# Patient Record
Sex: Male | Born: 1970 | Race: White | Hispanic: No | Smoking: Never smoker
Health system: Southern US, Community
[De-identification: ages and names within clinical notes are randomized; demographics above are authoritative.]

## PROBLEM LIST (undated history)

## (undated) HISTORY — PX: SINUSOTOMY: SHX291

---

## 2005-08-07 ENCOUNTER — Emergency Department (HOSPITAL_COMMUNITY): Admission: EM | Admit: 2005-08-07 | Discharge: 2005-08-07 | Payer: Self-pay | Admitting: Emergency Medicine

## 2008-02-11 ENCOUNTER — Emergency Department (HOSPITAL_COMMUNITY): Admission: EM | Admit: 2008-02-11 | Discharge: 2008-02-12 | Payer: Self-pay | Admitting: Emergency Medicine

## 2011-05-20 LAB — COMPREHENSIVE METABOLIC PANEL
ALT: 19
Alkaline Phosphatase: 54
BUN: 18
CO2: 27
Chloride: 105
GFR calc non Af Amer: >60
Glucose, Bld: 105 — ABNORMAL HIGH
Potassium: 3.7
Sodium: 138
Total Bilirubin: 0.7

## 2011-05-20 LAB — CBC
HCT: 40.5
Hemoglobin: 14.1
RBC: 4.34
RDW: 12.5

## 2011-05-20 LAB — DIFFERENTIAL
Basophils Absolute: 0
Basophils Relative: 1
Eosinophils Absolute: 0
Neutro Abs: 5.3
Neutrophils Relative %: 77

## 2011-05-20 LAB — POCT CARDIAC MARKERS
Myoglobin, poc: 41
Operator id: 151321
Operator id: 294341
Troponin i, poc: <0.05

## 2012-06-10 ENCOUNTER — Emergency Department (HOSPITAL_COMMUNITY): Payer: 59

## 2012-06-10 ENCOUNTER — Emergency Department (HOSPITAL_COMMUNITY)
Admission: EM | Admit: 2012-06-10 | Discharge: 2012-06-10 | Disposition: A | Discharge status: Home / Self Care | Payer: 59 | Attending: Emergency Medicine | Admitting: Emergency Medicine

## 2012-06-10 ENCOUNTER — Encounter (HOSPITAL_COMMUNITY): Payer: Self-pay | Admitting: *Deleted

## 2012-06-10 DIAGNOSIS — Y990 Civilian activity done for income or pay: Secondary | ICD-10-CM | POA: Insufficient documentation

## 2012-06-10 DIAGNOSIS — X500XXA Overexertion from strenuous movement or load, initial encounter: Secondary | ICD-10-CM | POA: Insufficient documentation

## 2012-06-10 DIAGNOSIS — S92309A Fracture of unspecified metatarsal bone(s), unspecified foot, initial encounter for closed fracture: Secondary | ICD-10-CM | POA: Insufficient documentation

## 2012-06-10 DIAGNOSIS — Y9269 Other specified industrial and construction area as the place of occurrence of the external cause: Secondary | ICD-10-CM | POA: Insufficient documentation

## 2012-06-10 DIAGNOSIS — R609 Edema, unspecified: Secondary | ICD-10-CM | POA: Insufficient documentation

## 2012-06-10 DIAGNOSIS — S92353A Displaced fracture of fifth metatarsal bone, unspecified foot, initial encounter for closed fracture: Secondary | ICD-10-CM

## 2012-06-10 MED ORDER — HYDROCODONE-ACETAMINOPHEN 5-325 MG PO TABS: 1.0000 | ORAL_TABLET | ORAL | Status: DC | PRN

## 2012-06-10 NOTE — ED Notes (Signed)
Pt was walking and rolled his foot.  He heard a pop and experienced pain.  He came immediately here.

## 2012-06-10 NOTE — ED Provider Notes (Signed)
History     CSN: [id number]  Arrival date & time 06/10/12  2113   None     Chief Complaint  Patient presents with  . Ankle Injury    (Consider location/radiation/quality/duration/timing/severity/associated sxs/prior treatment) HPI History provided by pt.   Pt is a IT sales professional.  Was wearing new hard-soled boots, did not have them laced tightly, inverted his foot while bending over top pick something up off of the ground and has had pain of right lateral foot ever since.  Aggravated by bearing weight and associated w/ edema.  Denies paresthesias.  Denies ankle pain.  History reviewed. No pertinent past medical history.  Past Surgical History  Procedure Date  . Sinusotomy     No family history on file.  History  Substance Use Topics  . Smoking status: Never Smoker   . Smokeless tobacco: Not on file  . Alcohol Use: No      Review of Systems  All other systems reviewed and are negative.    Allergies  Penicillins  Home Medications  No current outpatient prescriptions on file.  BP 157/71  Pulse 64  Temp 98.5 F (36.9 C) (Oral)  Resp 18  SpO2 98%  Physical Exam  Nursing note and vitals reviewed. Constitutional: He is oriented to person, place, and time. He appears well-developed and well-nourished. No distress.  HENT:  Head: Normocephalic and atraumatic.  Eyes:       Normal appearance  Neck: Normal range of motion.  Pulmonary/Chest: Effort normal.  Musculoskeletal: Normal range of motion.       Edema, mild ecchymosis and tenderness of fifth metatarsal.  Full active ROM of all toes as well as ankle w/out pain.  2+ DP pulse and distal sensation intact.    Neurological: He is alert and oriented to person, place, and time.  Psychiatric: He has a normal mood and affect. His behavior is normal.    ED Course  Procedures (including critical care time)  Labs Reviewed - No data to display Dg Foot Complete Left  06/10/2012  *RADIOLOGY REPORT*  Clinical Data:  Pain and swelling near the fifth metatarsal region after twisting injury.  LEFT FOOT - COMPLETE 3+ VIEW  Comparison: None.  Findings: Degenerative changes in the first metatarsophalangeal joint.  Transverse linear lucency at the base of the fifth metatarsal consistent with nondisplaced fracture.  Overlying soft tissue swelling.  No additional fractures.  No focal bone lesions.  IMPRESSION: Nondisplaced transverse fracture of the proximal fifth metatarsal. Degenerative changes in the first metatarsophalangeal joint.   Original Report Authenticated By: Marlon Pel, M.D.      1. Fracture of fifth metatarsal bone       MDM  [phone] M presents w/ inversion injury of L foot. Xray shows fx of right 5th metatarsal.  Results discussed w/ pt.  Ortho tech placed in cam walker.  Offered posterior splint but patient prefers not to use crutches.  Prescribed vicodin for pain, recommended ice and elevation and referred to ortho. 11:18 PM        Otilio Miu, PA 06/10/12 [phone]

## 2012-06-10 NOTE — ED Notes (Signed)
Patient given copy of discharge paperwork; went over discharge instructions with patient.  Patient instructed to take Vicodin as directed, to not drive or drink while taking Vicodin, to follow up with [facility] first thing Monday morning, and to return to the ED for new, worsening, or concerning symptoms.

## 2012-06-10 NOTE — ED Notes (Signed)
Patient currently resting quietly in bed; no respiratory or acute distress noted.  Patient updated on plan of care; informed patient that we are currently waiting on ortho to come and apply cam walker.  Patient has no other questions or concerns at this time; will continue to monitor.

## 2012-06-11 NOTE — ED Provider Notes (Signed)
Medical screening examination/treatment/procedure(s) were performed by non-physician practitioner and as supervising physician I was immediately available for consultation/collaboration.  Brandt Loosen, MD 06/11/12 [phone]

## 2016-03-15 ENCOUNTER — Other Ambulatory Visit: Payer: Self-pay | Admitting: Occupational Medicine

## 2016-03-15 ENCOUNTER — Ambulatory Visit: Payer: Self-pay

## 2016-03-15 DIAGNOSIS — M79675 Pain in left toe(s): Secondary | ICD-10-CM

## 2016-08-16 ENCOUNTER — Emergency Department (HOSPITAL_BASED_OUTPATIENT_CLINIC_OR_DEPARTMENT_OTHER)
Admission: EM | Admit: 2016-08-16 | Discharge: 2016-08-16 | Disposition: A | Discharge status: Home / Self Care | Payer: Commercial Managed Care - HMO | Attending: Emergency Medicine | Admitting: Emergency Medicine

## 2016-08-16 ENCOUNTER — Encounter (HOSPITAL_BASED_OUTPATIENT_CLINIC_OR_DEPARTMENT_OTHER): Payer: Self-pay

## 2016-08-16 DIAGNOSIS — Y929 Unspecified place or not applicable: Secondary | ICD-10-CM | POA: Diagnosis not present

## 2016-08-16 DIAGNOSIS — T23262A Burn of second degree of back of left hand, initial encounter: Secondary | ICD-10-CM | POA: Diagnosis not present

## 2016-08-16 DIAGNOSIS — Y999 Unspecified external cause status: Secondary | ICD-10-CM | POA: Diagnosis not present

## 2016-08-16 DIAGNOSIS — X100XXA Contact with hot drinks, initial encounter: Secondary | ICD-10-CM | POA: Insufficient documentation

## 2016-08-16 DIAGNOSIS — Z23 Encounter for immunization: Secondary | ICD-10-CM | POA: Insufficient documentation

## 2016-08-16 DIAGNOSIS — T23002A Burn of unspecified degree of left hand, unspecified site, initial encounter: Secondary | ICD-10-CM | POA: Diagnosis present

## 2016-08-16 DIAGNOSIS — Y9389 Activity, other specified: Secondary | ICD-10-CM | POA: Insufficient documentation

## 2016-08-16 MED ORDER — LIDOCAINE 5 % EX OINT: 1.0000 "application " | TOPICAL_OINTMENT | Freq: Four times a day (QID) | CUTANEOUS | 0 refills | Status: DC | PRN

## 2016-08-16 MED ORDER — OXYCODONE HCL 5 MG PO TABS: 5.0000 mg | ORAL_TABLET | ORAL | 0 refills | Status: DC | PRN

## 2016-08-16 MED ORDER — LIDOCAINE 4 % EX CREA
TOPICAL_CREAM | Freq: Once | CUTANEOUS | Status: AC
  Administered 2016-08-16: 1 via TOPICAL
  Filled 2016-08-16: qty 5

## 2016-08-16 MED ORDER — FENTANYL CITRATE (PF) 100 MCG/2ML IJ SOLN
50.0000 ug | INTRAMUSCULAR | Status: DC | PRN
  Administered 2016-08-16: 50 ug via NASAL
  Filled 2016-08-16: qty 2

## 2016-08-16 MED ORDER — TETANUS-DIPHTH-ACELL PERTUSSIS 5-2.5-18.5 LF-MCG/0.5 IM SUSP
0.5000 mL | Freq: Once | INTRAMUSCULAR | Status: AC
  Administered 2016-08-16: 0.5 mL via INTRAMUSCULAR
  Filled 2016-08-16: qty 0.5

## 2016-08-16 MED ORDER — BACITRACIN ZINC 500 UNIT/GM EX OINT
TOPICAL_OINTMENT | Freq: Two times a day (BID) | CUTANEOUS | Status: DC
  Administered 2016-08-16: 1 via TOPICAL
  Filled 2016-08-16 (×2): qty 28.35

## 2016-08-16 NOTE — Discharge Instructions (Signed)
Place bacitracin over your burn, change dressing twice daily using a nonadhesive bandage. Take tylenol 1000mg  every 6 hours for pain and ibuprofen 400mg  every 4 hours and use oxycodone 5mg  as needed for breakthrough pain.

## 2016-08-16 NOTE — ED Triage Notes (Signed)
C/o coffee burn to left hand approx 1-2 hours PTA-blisters and peeling skin noted-pt presented to triage with hand in water

## 2016-08-16 NOTE — ED Notes (Signed)
Pt has good sensation and brisk cap refill to distal finger tips

## 2016-08-16 NOTE — ED Provider Notes (Signed)
[facility]   CSN: [id number] Arrival date & time: 08/16/16  1346     History   Chief Complaint Chief Complaint  Patient presents with  . Hand Burn    HPI Derek Hernandez is a 45 y.o. male.  HPI  Presents with concern for burn to the left hand. Patient making coffee this morning and cup fell down causing hot coffee grinds spill on his hand causing a burn. Patient not sure if UTD on tetanus.  Pain is severe however is improved when submerged in water. Incident happened just prior to arrival.   History reviewed. No pertinent past medical history.  There are no active problems to display for this patient.   Past Surgical History:  Procedure Laterality Date  . SINUSOTOMY         Home Medications    Prior to Admission medications   Medication Sig Start Date End Date Taking? Authorizing Provider  lidocaine (XYLOCAINE) 5 % ointment Apply 1 application topically 4 (four) times daily as needed. Apply 3 inches of cream or less every 6 hours as needed for pain. 08/16/16   Alvira MondayErin Jarryd Gratz, MD  oxyCODONE (ROXICODONE) 5 MG immediate release tablet Take 1 tablet (5 mg total) by mouth every 4 (four) hours as needed for severe pain. 08/16/16   Alvira MondayErin Naz Denunzio, MD    Family History No family history on file.  Social History Social History  Substance Use Topics  . Smoking status: Never Smoker  . Smokeless tobacco: Never Used  . Alcohol use No     Allergies   Penicillins   Review of Systems Review of Systems  Constitutional: Negative for fever.  HENT: Negative for sore throat.   Eyes: Negative for visual disturbance.  Respiratory: Negative for shortness of breath.   Genitourinary: Negative for difficulty urinating.  Skin: Positive for wound. Negative for rash.  Neurological: Negative for syncope and headaches.     Physical Exam Updated Vital Signs BP 123/95   Pulse 68   Temp 97.8 F (36.6 C) (Oral)   Resp 20   Ht 5\' 10"  (1.778 m)   Wt  180 lb (81.6 kg)   SpO2 95%   BMI 25.83 kg/m   Physical Exam  Constitutional: He is oriented to person, place, and time. He appears well-developed and well-nourished. No distress.  HENT:  Head: Normocephalic and atraumatic.  Eyes: Conjunctivae and EOM are normal.  Neck: Normal range of motion.  Cardiovascular: Normal rate, regular rhythm and intact distal pulses.   Pulmonary/Chest: Effort normal. No respiratory distress.  Abdominal: Soft.  Musculoskeletal: He exhibits no edema.  Neurological: He is alert and oriented to person, place, and time.  Skin: Skin is warm and dry. He is not diaphoretic.  Partial thickness burns to left hand: 3cm palmar radial side Indiex finger sparing radial side, middle finger sparing radial side, ring finger burn onradial side, skin peeling, pinky finger radial side Dorsum of hand, erythema, skin sloughing, no intact blisters No circumferential burns  Nursing note and vitals reviewed.    ED Treatments / Results  Labs (all labs ordered are listed, but only abnormal results are displayed) Labs Reviewed - No data to display  EKG  EKG Interpretation None       Radiology No results found.  Procedures Procedures (including critical care time)  Medications Ordered in ED Medications  Tdap (BOOSTRIX) injection 0.5 mL (0.5 mLs Intramuscular Given 08/16/16 1511)  lidocaine (LMX) 4 % cream (1 application Topical Given 08/16/16 1515)  Initial Impression / Assessment and Plan / ED Course  I have reviewed the triage vital signs and the nursing notes.  Pertinent labs & imaging results that were available during my care of the patient were reviewed by me and considered in my medical decision making (see chart for details).  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  Patient with partial thickness burns to left hand. No insensate areas, no sign of full  thickness at this time. No circumferential burns.  Updated tetanus. Gave bacitracin, recommend nonadherent bandage with bacitracin BID. Pt requiesting lidocaine ointment-gave rx for this with specific instructions to not use more than directed.  Rec tylenol, ibuprofen, oxycodone for breakthrough pain.  Patient discharged in stable condition with understanding of reasons to return.   Final Clinical Impressions(s) / ED Diagnoses   Final diagnoses:  Partial thickness burn of back of left hand, initial encounter    New Prescriptions Discharge Medication List as of 08/16/2016  3:09 PM    START taking these medications   Details  lidocaine (XYLOCAINE) 5 % ointment Apply 1 application topically 4 (four) times daily as needed. Apply 3 inches of cream or less every 6 hours as needed for pain., Starting Mon 08/16/2016, Print    oxyCODONE (ROXICODONE) 5 MG immediate release tablet Take 1 tablet (5 mg total) by mouth every 4 (four) hours as needed for severe pain., Starting Mon 08/16/2016, Print         Alvira MondayErin Denise Washburn, MD 08/16/16 2053

## 2016-08-16 NOTE — ED Notes (Signed)
Pt teaching provided on medications that may cause drowsiness. Pt instructed not to drive or operate heavy machinery while taking the prescribed medication. Pt verbalized understanding.   

## 2018-05-08 ENCOUNTER — Ambulatory Visit (INDEPENDENT_AMBULATORY_CARE_PROVIDER_SITE_OTHER): Payer: Self-pay

## 2018-05-08 ENCOUNTER — Encounter (INDEPENDENT_AMBULATORY_CARE_PROVIDER_SITE_OTHER): Payer: Self-pay | Admitting: Orthopaedic Surgery

## 2018-05-08 ENCOUNTER — Ambulatory Visit (INDEPENDENT_AMBULATORY_CARE_PROVIDER_SITE_OTHER): Payer: 59 | Admitting: Orthopaedic Surgery

## 2018-05-08 DIAGNOSIS — G8929 Other chronic pain: Secondary | ICD-10-CM

## 2018-05-08 DIAGNOSIS — M25551 Pain in right hip: Secondary | ICD-10-CM

## 2018-05-08 DIAGNOSIS — M25561 Pain in right knee: Secondary | ICD-10-CM

## 2018-05-08 NOTE — Progress Notes (Signed)
Office Visit Note   Patient: Derek Hernandez           Date of Birth: Dec 20, 1970           MRN: [id number] Visit Date: 05/08/2018              Requested by: Ignatius SpeckingVyas, Dhruv B, MD [address] [place], [place] [id number] PCP: Ignatius SpeckingVyas, Dhruv B, MD   Assessment & Plan: Visit Diagnoses:  1. Chronic pain of right knee   2. Pain in right hip     Plan: I did show him quad strengthening exercises especially ones involving the VMO.  I offered him a steroid injection in his right knee as well that would hopefully be diagnostic and therapeutic and he is agreeable to this.  He tolerated the injection well.  I would like to reevaluate his knee in 3 weeks.  If he still having issues we would obtain an MRI.  I do feel that his right hip has no issues.  All questions concerns were answered and addressed.  Follow-Up Instructions: Return in about 3 weeks (around 05/29/2018).   Orders:  Orders Placed This Encounter  Procedures  . XR HIP UNILAT W OR W/O PELVIS 1V RIGHT  . XR Knee 1-2 Views Right   No orders of the defined types were placed in this encounter.     Procedures: No procedures performed   Clinical Data: No additional findings.   Subjective: Chief Complaint  Patient presents with  . 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  He says really the knee was hurting the worst but it does hurt some of the hip as well.  He was doing CrossFit and has started to bother him quite a bit and is not really calm down after 3 months of resting his knee.  Hurts in the back of the knee.  He denies any groin pain.  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  He is never injured his hip or knee before.  He denies any locking catching.  Most of his pain in his knee is posterior medial.  HPI  Review of Systems He currently denies any headache, chest pain, shortness of breath, fever, chills, nausea,  vomiting.  Objective: Vital Signs: There were no vitals taken for this visit.  Physical Exam Is alert and oriented x3 and in no acute distress Ortho Exam Examined for his right hip is normal examination of his right knee does show patellofemoral crepitation with lateral tracking patella.  He has pain along the medial joint line as well.  His range of motion is full. Specialty Comments:  No specialty comments available.  Imaging: Xr Hip Unilat W Or W/o Pelvis 1v Right  Result Date: 05/08/2018 An AP pelvis and lateral of the right hip show no acute findings.  The hip joint is well-maintained with excellent space and no significant arthritic findings at all.  Xr Knee 1-2 Views Right  Result Date: 05/08/2018 2 views of the right knee show no acute findings.  The joint space is well-maintained.  There is patellofemoral arthritic changes.    PMFS History: There are no active problems to display for this patient.  History reviewed. No pertinent past medical history.  History reviewed. No pertinent family history.  Past Surgical History:  Procedure Laterality Date  . SINUSOTOMY     Social History   Occupational History  .  Not on file  Tobacco Use  . Smoking status: Never Smoker  . Smokeless tobacco: Never Used  Substance and Sexual Activity  . Alcohol use: No  . Drug use: No  . Sexual activity: Not on file

## 2018-05-29 ENCOUNTER — Encounter (INDEPENDENT_AMBULATORY_CARE_PROVIDER_SITE_OTHER): Payer: Self-pay | Admitting: Orthopaedic Surgery

## 2018-05-29 ENCOUNTER — Ambulatory Visit (INDEPENDENT_AMBULATORY_CARE_PROVIDER_SITE_OTHER): Payer: 59 | Admitting: Orthopaedic Surgery

## 2018-05-29 VITALS — Ht 70.0 in | Wt 180.0 lb

## 2018-05-29 DIAGNOSIS — G8929 Other chronic pain: Secondary | ICD-10-CM

## 2018-05-29 DIAGNOSIS — M25561 Pain in right knee: Principal | ICD-10-CM

## 2018-05-29 NOTE — Progress Notes (Signed)
The patient comes in today 3 weeks after a place a steroid injection in his right knee.  He says right knee pain is gone almost completely.  He has a little bit of pain over the IT band where he points to.  I did see his sister recently he will may take a look at his lumbar spine.  She showed me AP view on her phone.  It did show some degenerative changes at lower levels of lumbar spine.  He confirms that he is going to chiropractor and that treatment is helping.  He says his right knee is pain-free right now other than pain over the IT band.  On examination of his right knee he has full range of motion of his right knee and it is ligamentously stable.  There is no effusion.  His pain is only over the IT band.  We went over stretching exercises were his IT band.  All question concerns were answered and addressed.  He will follow-up as needed.  If his knee gets no problem in the future we would like to recommend an MRI.  He will continue quad training exercises as well.

## 2018-10-10 ENCOUNTER — Encounter (INDEPENDENT_AMBULATORY_CARE_PROVIDER_SITE_OTHER): Payer: Self-pay | Admitting: Orthopaedic Surgery

## 2018-10-10 ENCOUNTER — Ambulatory Visit (INDEPENDENT_AMBULATORY_CARE_PROVIDER_SITE_OTHER): Payer: 59 | Admitting: Orthopaedic Surgery

## 2018-10-10 ENCOUNTER — Other Ambulatory Visit (INDEPENDENT_AMBULATORY_CARE_PROVIDER_SITE_OTHER): Payer: Self-pay

## 2018-10-10 DIAGNOSIS — G8929 Other chronic pain: Secondary | ICD-10-CM

## 2018-10-10 DIAGNOSIS — M25561 Pain in right knee: Principal | ICD-10-CM

## 2018-10-10 NOTE — Progress Notes (Signed)
Office Visit Note   Patient: Derek Hernandez           Date of Birth: [birth year]           MRN: [id number] Visit Date: 10/10/2018              Requested by: Ignatius Specking, MD [address] [place], [place] [address] PCP: Ignatius Specking, MD   Assessment & Plan: Visit Diagnoses:  1. Chronic pain of right knee     Plan: Given the failure of all forms of conservative treatment including the failure of injections and, an MRI is warranted of his right knee to rule out a meniscal tear.  Of interest to see what the cartilage looks like in the patellofemoral joint as well as patella tendon itself in the quad tendon itself in the amount of pain he is having.  I do feel this is medically warranted at this point given the locking catching as well and her clinical exam findings.  He will avoid squats and lunges for now.  We will see him back in 2 weeks hopefully go over an MRI of his right knee.  All question concerns were answered and addressed.  Follow-Up Instructions: Return in about 2 weeks (around 10/24/2018).   Orders:  No orders of the defined types were placed in this encounter.  No orders of the defined types were placed in this encounter.     Procedures: No procedures performed   Clinical Data: No additional findings.   Subjective: Chief Complaint  Patient presents with  . Right Knee - Follow-up, Pain  The patient comes in today with an acute exacerbation of chronic right knee issues.  I have seen him for this knee before.  He is not overweight at all.  He is very athletic and active.  He is a Corporate treasurer.  He is tried activity modification.  He is work on Freight forwarder.  He is taken anti-inflammatories.  We have tried to offload the knee.  He is even had an intra-articular steroid injection which did alleviate his symptoms for a while.  At this point he is having locking and catching in his knee and he does have intermittent swelling.  It hurts in the back of his knee  and feels full back there.  He gets a lot of popping in his knee that is newer to him.  He denies any hip pain.  Denies any numbness and tingling in his feet.  He denies any low back issues.  He is not a diabetic.  HPI  Review of Systems He currently denies any headache, chest pain, shortness of breath, fever, chills, nausea, vomiting.  Objective: Vital Signs: There were no vitals taken for this visit.  Physical Exam He is alert and orient x3 and in no acute distress.  He is very physically fit and thin. Ortho Exam Examination of his left knee shows no effusion today.  He has pain along the lateral collateral ligament and IT band.  He also has pain at the inferior pole patella and the quad tendon.  He has a positive Murray sign to the lateral compartment of his knee.  There is lateral joint line tenderness.  When I start flexion past 90 degrees and externally rotate the knee he gets a lot of pain in his knee.  It feels ligamentously stable.  The patella does track laterally with flexion extension. Specialty Comments:  No specialty comments available.  Imaging: No results found.  X-rays from last year are independently reviewed again and show patellofemoral arthritic change in the knee but well-maintained alignment and well-maintained medial lateral compartments.  PMFS History: There are no active problems to display for this patient.  History reviewed. No pertinent past medical history.  History reviewed. No pertinent family history.  Past Surgical History:  Procedure Laterality Date  . SINUSOTOMY     Social History   Occupational History  . Not on file  Tobacco Use  . Smoking status: Never Smoker  . Smokeless tobacco: Never Used  Substance and Sexual Activity  . Alcohol use: No  . Drug use: No  . Sexual activity: Not on file

## 2018-10-20 ENCOUNTER — Ambulatory Visit
Admission: RE | Admit: 2018-10-20 | Discharge: 2018-10-20 | Disposition: A | Discharge status: Home / Self Care | Payer: 59 | Source: Ambulatory Visit | Attending: Orthopaedic Surgery | Admitting: Orthopaedic Surgery

## 2018-10-20 DIAGNOSIS — G8929 Other chronic pain: Secondary | ICD-10-CM

## 2018-10-20 DIAGNOSIS — M25561 Pain in right knee: Principal | ICD-10-CM

## 2018-10-24 ENCOUNTER — Telehealth (INDEPENDENT_AMBULATORY_CARE_PROVIDER_SITE_OTHER): Payer: Self-pay

## 2018-10-24 ENCOUNTER — Encounter (INDEPENDENT_AMBULATORY_CARE_PROVIDER_SITE_OTHER): Payer: Self-pay | Admitting: Orthopaedic Surgery

## 2018-10-24 ENCOUNTER — Ambulatory Visit (INDEPENDENT_AMBULATORY_CARE_PROVIDER_SITE_OTHER): Payer: 59 | Admitting: Orthopaedic Surgery

## 2018-10-24 DIAGNOSIS — M1711 Unilateral primary osteoarthritis, right knee: Secondary | ICD-10-CM

## 2018-10-24 DIAGNOSIS — G8929 Other chronic pain: Secondary | ICD-10-CM | POA: Diagnosis not present

## 2018-10-24 DIAGNOSIS — M25561 Pain in right knee: Secondary | ICD-10-CM | POA: Diagnosis not present

## 2018-10-24 NOTE — Progress Notes (Signed)
The patient comes in today to go over an MRI of his right knee.  He is a very thin and active athletic individual with chronic right knee issues.  He had already had a steroid injection in his knee as well.  On exam his patella does track laterally and her significant patellofemoral grinding.  There is no medial lateral joint line tenderness and the knee is ligamentously stable.  There is no effusion.  The MRI does show advanced for age arthritis in his knee.  There is denuding of the cartilage of the lateral patella facet in the trochlear groove.  The medial lateral compartments are well-maintained.  His ACL and PCL are intact.  The medial lateral meniscus are intact.  He does have arthritis in his knee and he is a perfect candidate for hyaluronic acid at this standpoint.  He knows to avoid high impact aerobic activities as well.  All questions concerns were answered and addressed.  We will work on ordering hyaluronic acid injection for his right knee which I do feel is very appropriate given the fact that most of his cartilage is intact except for the patellofemoral joint and this can help his knee in general.  We will see him back in 3 weeks to hopefully place hyaluronic acid into the right knee.

## 2018-10-24 NOTE — Telephone Encounter (Signed)
Right knee gel injection  

## 2018-10-26 ENCOUNTER — Telehealth (INDEPENDENT_AMBULATORY_CARE_PROVIDER_SITE_OTHER): Payer: Self-pay

## 2018-10-26 NOTE — Telephone Encounter (Signed)
Noted  

## 2018-10-26 NOTE — Telephone Encounter (Signed)
Submitted VOB for Durolane, right knee. 

## 2018-11-13 ENCOUNTER — Telehealth (INDEPENDENT_AMBULATORY_CARE_PROVIDER_SITE_OTHER): Payer: Self-pay

## 2018-11-13 NOTE — Telephone Encounter (Signed)
Called patient and asked the screening questions.  Do you have now or have you had in the past 7 days a fever and/or chills? NO  Do you have now or have you had in the past 7 days a cough? NO  Do you have now or have you had in the last 7 days nausea, vomiting or abdominal pain? NO  Have you been exposed to anyone who has tested positive for COVID-19? NO  Have you or anyone who lives with you traveled within the last month? NO 

## 2018-11-14 ENCOUNTER — Encounter (INDEPENDENT_AMBULATORY_CARE_PROVIDER_SITE_OTHER): Payer: Self-pay | Admitting: Orthopaedic Surgery

## 2018-11-14 ENCOUNTER — Ambulatory Visit (INDEPENDENT_AMBULATORY_CARE_PROVIDER_SITE_OTHER): Payer: 59 | Admitting: Orthopaedic Surgery

## 2018-11-14 ENCOUNTER — Other Ambulatory Visit: Payer: Self-pay

## 2018-11-14 DIAGNOSIS — M1711 Unilateral primary osteoarthritis, right knee: Secondary | ICD-10-CM

## 2018-11-14 MED ORDER — SODIUM HYALURONATE 60 MG/3ML IX PRSY
60.0000 mg | PREFILLED_SYRINGE | INTRA_ARTICULAR | Status: AC | PRN
  Administered 2018-11-14: 60 mg via INTRA_ARTICULAR

## 2018-11-14 NOTE — Progress Notes (Signed)
   Procedure Note  Patient: Derek Hernandez             Date of Birth: 08/13/1971           MRN: [id number]             Visit Date: 11/14/2018  Procedures: Visit Diagnoses: Unilateral primary osteoarthritis, right knee  Large Joint Inj on 11/14/2018 9:03 AM Indications: diagnostic evaluation and pain Details: 22 G 1.5 in needle, superolateral approach  Arthrogram: No  Medications: 60 mg Sodium Hyaluronate 60 MG/3ML Outcome: tolerated well, no immediate complications Procedure, treatment alternatives, risks and benefits explained, specific risks discussed. Consent was given by the patient. Immediately prior to procedure a time out was called to verify the correct patient, procedure, equipment, support staff and site/side marked as required. Patient was prepped and draped in the usual sterile fashion.     The patient is here today for scheduled hyaluronic acid injection with Durolane to treat the pain from osteoarthritis.  He is tolerated steroid injections well.  He has had no other acute changes in medical status.  He does have family members that have had joint replacements at earlier ages.  He denies any instability with his right knee.  On exam there is no right knee effusion.  He is got excellent range of motion of the knee and is ligamentously stable.  He has slight varus malalignment with medial joint line tenderness.  He tolerated the Durolane injection well.  All questions concerns were answered and addressed.  Follow-up will be as needed.  Certainly if he has worsening pain over the next few months we can always try a steroid injection if needed.

## 2021-02-14 IMAGING — MR MR KNEE*R* W/O CM
6 series · 37 of 40 positions shown · non-contrast
Comparison: None.

CLINICAL DATA: Medial and anterior right knee pain for 6 months. No
known injury.

EXAM:
MRI OF THE RIGHT KNEE WITHOUT CONTRAST
TECHNIQUE: Multiplanar, multisequence MR imaging of the knee was performed. No
intravenous contrast was administered.

[Series 6: T2 fat-sat · axial · right · 4.0mm · 0.50mm/px · z∈[-98,+56]mm · 7 of 36 slices shown (1 of 3)]
[im 1/36]
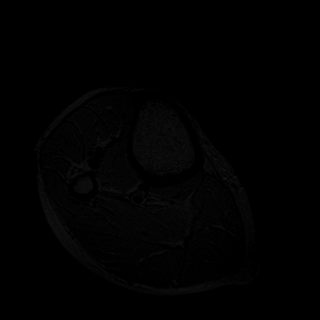
[im 6/36]
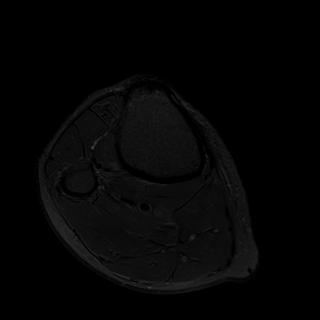
[im 12/36]
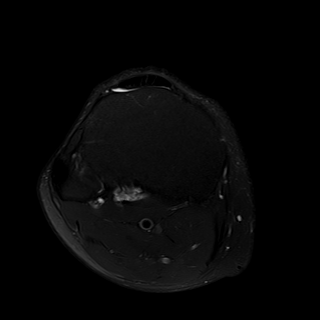
[im 18/36]
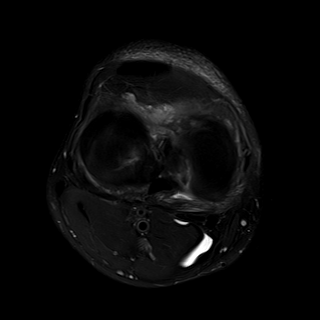
[im 24/36]
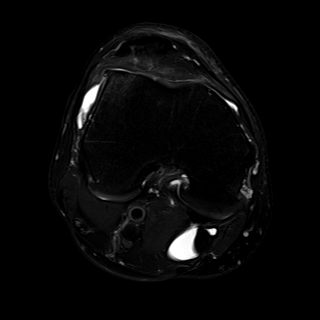
[im 30/36]
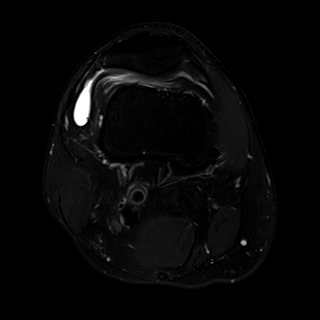
[im 36/36]
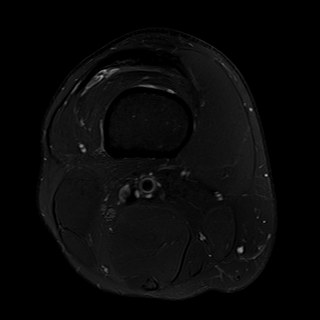

[Series 7: T2 fat-sat · coronal · right · 4.0mm · 0.39mm/px · 6 of 28 slices shown (2 of 3)]
[im 1/28]
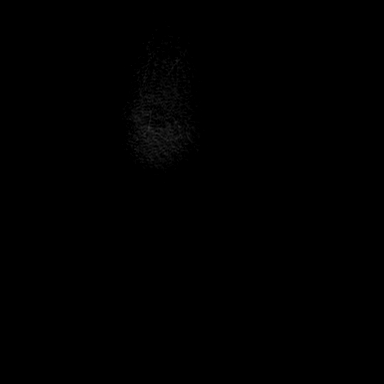
[im 6/28]
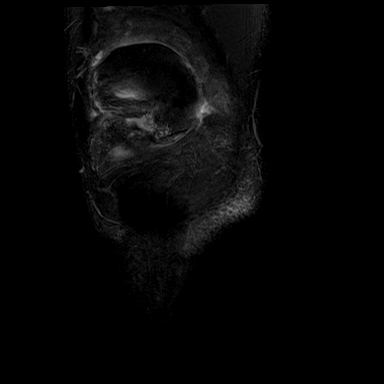
[im 11/28]
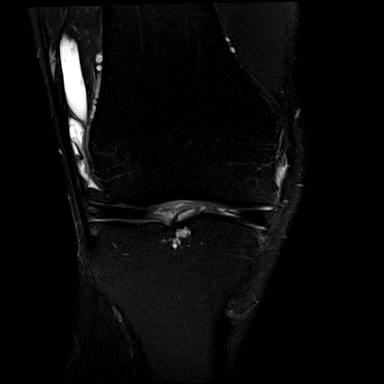
[im 17/28]
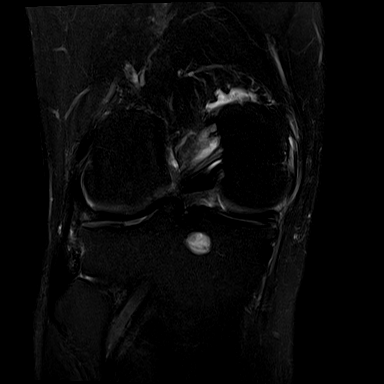
[im 22/28]
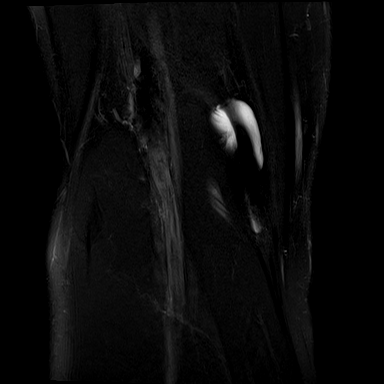
[im 28/28]
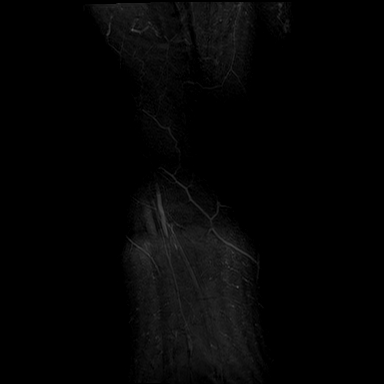

[Series 8: T1 · coronal · right · 4.0mm · 0.39mm/px · 3 of 28 slices shown]
[im 1/28]
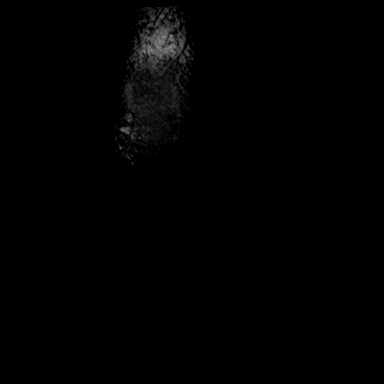
[im 6/28]
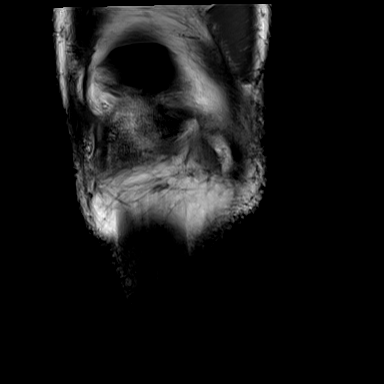
[im 11/28]
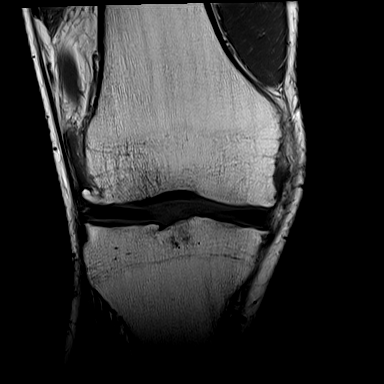

[Series 9: PD fat-sat · coronal · right · 3.0mm · 0.47mm/px · 7 of 32 slices shown (1 of 2)]
[im 1/32]
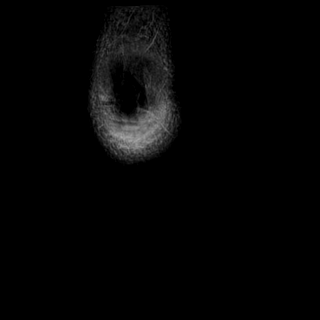
[im 6/32]
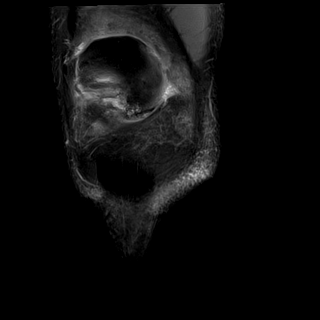
[im 11/32]
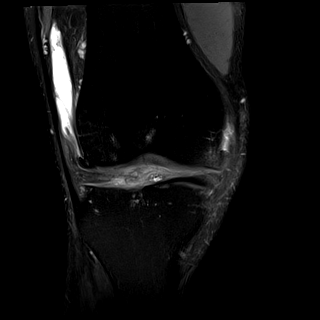
[im 16/32]
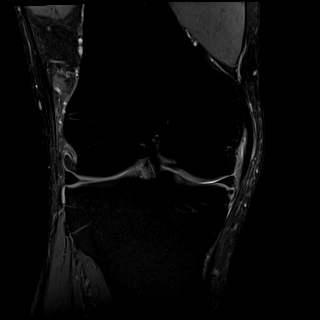
[im 21/32]
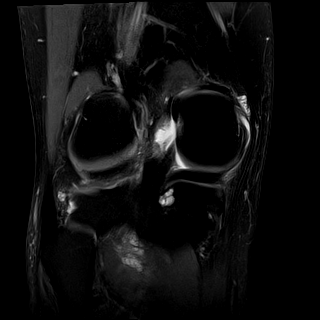
[im 26/32]
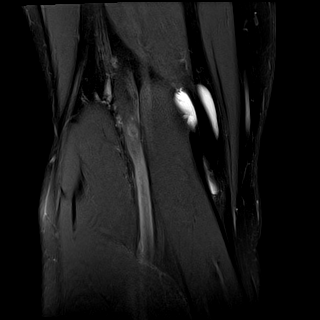
[im 32/32]
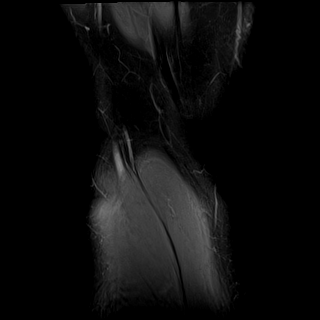

[Series 10: PD fat-sat · sagittal · right · 3.0mm · 0.39mm/px · 7 of 30 slices shown (2 of 2)]
[im 1/30]
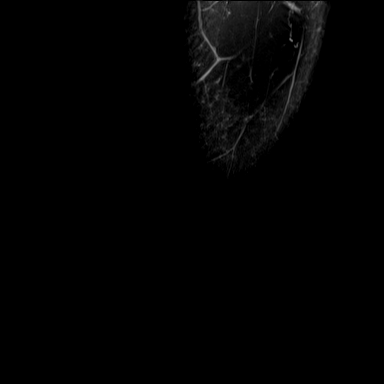
[im 5/30]
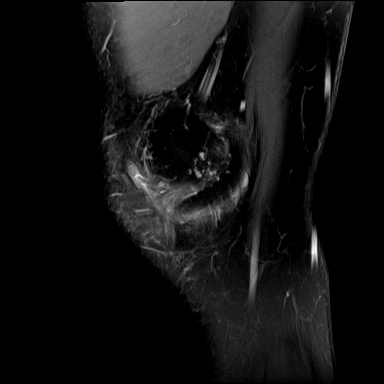
[im 10/30]
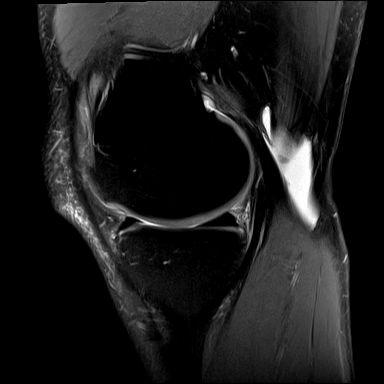
[im 15/30]
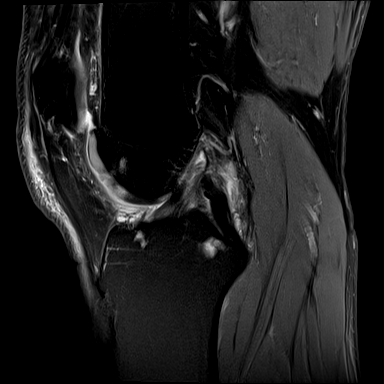
[im 20/30]
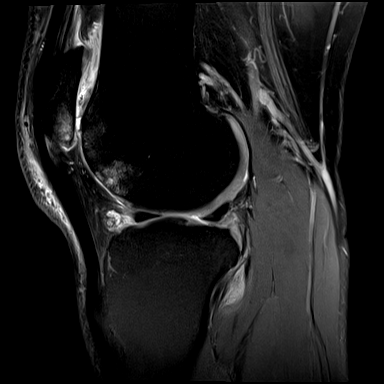
[im 25/30]
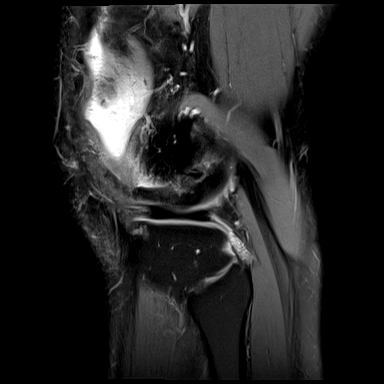
[im 30/30]
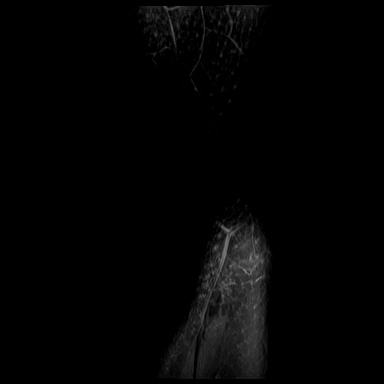

[Series 11: T2 fat-sat · sagittal · right · 3.0mm · 0.39mm/px · 7 of 30 slices shown (3 of 3)]
[im 1/30]
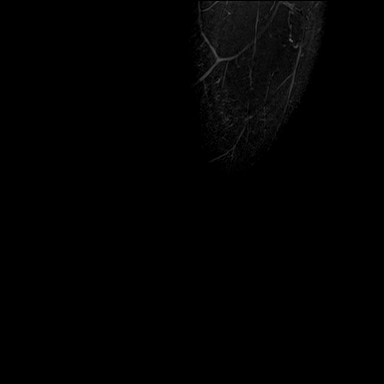
[im 5/30]
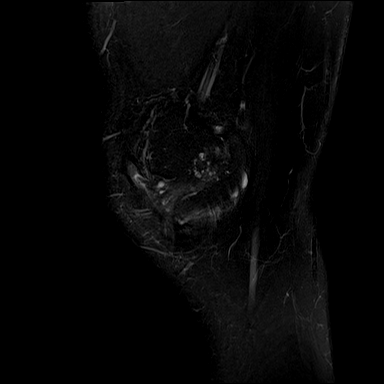
[im 10/30]
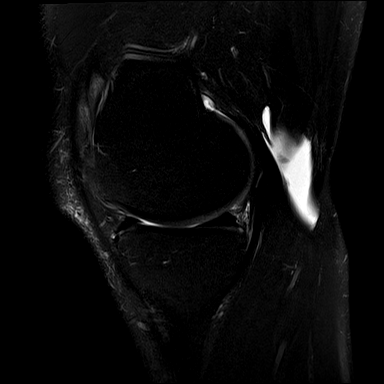
[im 15/30]
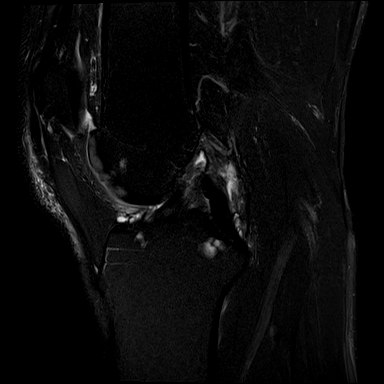
[im 20/30]
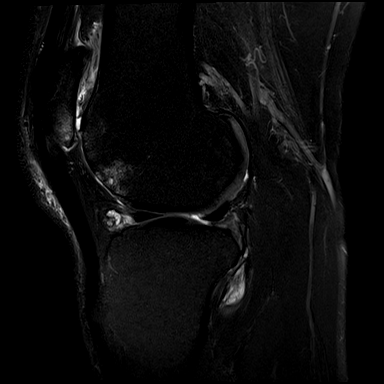
[im 25/30]
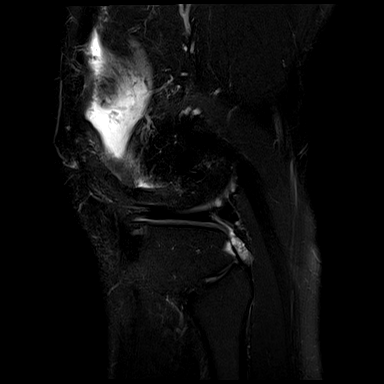
[im 30/30]
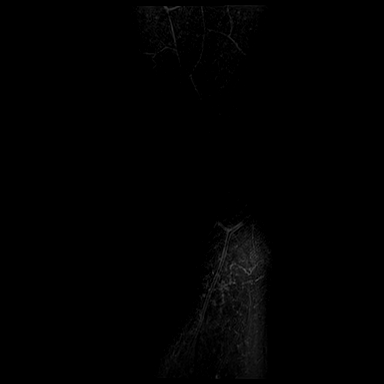

[37 of 40 positions shown; findings below may reference images not displayed]

FINDINGS: MENISCI

Medial meniscus:  Intact.

Lateral meniscus: Fraying along the free edge of the posterior horn
is identified.

LIGAMENTS

Cruciates:  Intact.

Collaterals:  Intact.

CARTILAGE

Patellofemoral: Cartilage along the lateral femoral trochlea and
lateral patellar facet is completely denuded.

Medial:  Mildly thinned without focal defect.

Lateral:  Moderately thinned.

Joint:  Small effusion.

Popliteal Fossa: Baker's cyst measures 1.4 cm transverse by 1.9 cm
AP by 4.6 cm craniocaudal. There is a cyst along the anterior aspect
of the popliteus muscle belly and tendon measuring approximately 4
cm craniocaudal by up to 2 cm transverse by 0.7 cm AP. The popliteus
is intact.

Extensor Mechanism:  Intact.

Bones: Bulky tricompartmental osteophytosis is present. There is
fairly extensive subchondral edema in both the lateral patellar
facet and lateral femoral trochlea. Small subchondral cyst in the
posterior aspect of the medial tibia is noted.

Other: None.
IMPRESSION: Dominant finding is advanced for age tricompartmental osteoarthritis
which is worst in the patellofemoral compartment.

Fraying along the free edge of the posterior horn of the lateral
meniscus. No meniscal or ligament tear.

Baker's cyst.

Ganglion cyst along the popliteus.
